# Patient Record
Sex: Male | Born: 2000 | Race: Black or African American | Hispanic: No | Marital: Single
Health system: Midwestern US, Community
[De-identification: ages and names within clinical notes are randomized; demographics above are authoritative.]

## PROBLEM LIST (undated history)

## (undated) DIAGNOSIS — G4733 Obstructive sleep apnea (adult) (pediatric): Secondary | ICD-10-CM

## (undated) DIAGNOSIS — Z9089 Acquired absence of other organs: Secondary | ICD-10-CM

## (undated) DIAGNOSIS — J351 Hypertrophy of tonsils: Secondary | ICD-10-CM

## (undated) DIAGNOSIS — G8918 Other acute postprocedural pain: Secondary | ICD-10-CM

---
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
## 2021-03-08 ENCOUNTER — Encounter: Payer: PRIVATE HEALTH INSURANCE | Attending: Internal Medicine | Primary: Internal Medicine

## 2021-03-08 NOTE — Assessment & Plan Note (Deleted)
Patient established in October 2022 with concerns that he would not be allowed back to school until he was caught up on vaccinations. Patient advised to get vaccinated at area health department. Patients weight at establishment 233.

## 2021-06-21 NOTE — Progress Notes (Signed)
Formatting of this note is different from the original.  Images from the original note were not included.  [facility]  [facility]  [address]  [place] [address]  Loc Appt: [phone]  Loc: [phone]     Name: Mike Marks  Age:  21 y.o.  Sex: male    CHIEF COMPLAINT:    Chief Complaint   Patient presents with    Nasal Congestion     Pt c/o congestion, asthma eczema, and cough for 3 days.      HPI:   The patient is a 21 year old obese male who presents today with multiple complaints.  Patient reports a history of allergic rhinitis, asthma, and eczema.      He reports a more than 1 month history of nasal congestion, rhinorrhea.  He has had some associated cough and postnasal drainage.  He is also had some fatigue.  He reports that the nasal congestion is interfering with his sleep.  He has been taking Advil DM for his symptoms, but this has not helped much.  This that he was on an antibiotic that she got from relative couple weeks ago and this seemed to help.    Patient also complaining of eczematous rash on the left AC fossa.  He has not done anything to treat this.  He does bathe with Dove soap.    Patient's mother is on the phone and reports that his symptoms have caused him to miss some work.  She is requesting documentation of his asthma, allergic rhinitis, and eczema.  I advised patient's mother that I could document allergic rhinitis and eczema but asthma would require pulmonary function tests which we would not perform here in the urgent care setting.  I advised her to follow-up with primary care if she needs more documentation than what I provided.    PAST MEDICAL HISTORY:  No past medical history on file.    PAST SURGICAL HISTORY:  No past surgical history on file.    MEDICATIONS:  Current Outpatient Medications   Medication Sig Dispense Refill    benzonatate (TESSALON PERLES) 100 mg capsule Take 1-2 capsules (100-200 mg) every 8 (eight) hours for 7 days 40  capsule 0    fexofenadine (Allergy Relief, fexofenadine,) 180 mg tablet Take 1 tablet (180 mg) by mouth daily 30 tablet 0    fluticasone propionate (FLONASE) 50 mcg/actuation nasal spray 1 spray into each nostril daily for 10 days 16 g 0    predniSONE 10 mg tablet Take 2 tablets (20 mg) by mouth daily for 5 days 10 tablet 0    triamcinolone (KENALOG) 0.1 % cream Apply topically 2 (two) times a day for 7 days 80 g 0     No current facility-administered medications for this visit.     ALLERGIES:  Patient has no known allergies.    SOCIAL HISTORY:      FAMILY HISTORY:  No family history on file.     REVIEW OF SYSTEMS:  Review of Systems   Constitutional:  Positive for activity change and fatigue. Negative for appetite change, chills and fever.   HENT:  Positive for congestion, postnasal drip and rhinorrhea. Negative for ear pain, sore throat and trouble swallowing.    Eyes:  Negative for pain, discharge, redness and itching.   Respiratory:  Positive for cough. Negative for chest tightness, shortness of breath and wheezing.    Cardiovascular:  Negative for chest pain and palpitations.   Gastrointestinal:  Negative for abdominal pain, blood in stool, constipation, diarrhea, nausea and vomiting.   Genitourinary:  Negative for decreased urine volume, dysuria, frequency, hematuria and urgency.   Musculoskeletal:  Negative for arthralgias, back pain, gait problem, joint swelling and myalgias.   Skin:  Positive for rash. Negative for color change and wound.   Neurological:  Negative for dizziness, seizures, speech difficulty, light-headedness, numbness and headaches.   Psychiatric/Behavioral:  Positive for sleep disturbance (Secondary to nasal congestion). Negative for agitation and confusion.      PHYSICAL EXAMINATION:  There were no vitals filed for this visit.  There is no height or weight on file to calculate BMI.  Vitals:    06/21/21 1133   BP: 124/80   Pulse: 57   Resp: 16   Temp: 97.6 F (36.4 C)   SpO2: 97%      Physical Exam  Vitals and nursing note reviewed.   Constitutional:       General: He is not in acute distress.     Appearance: He is well-developed. He is obese. He is not ill-appearing, toxic-appearing or diaphoretic.   HENT:      Head: Normocephalic and atraumatic.      Right Ear: Tympanic membrane, ear canal and external ear normal. There is no impacted cerumen.      Left Ear: Tympanic membrane, ear canal and external ear normal. There is no impacted cerumen.      Nose: Congestion and rhinorrhea present.      Mouth/Throat:      Mouth: Mucous membranes are moist.      Pharynx: Uvula midline. No oropharyngeal exudate or posterior oropharyngeal erythema.      Tonsils: No tonsillar abscesses.   Eyes:      General:         Right eye: No discharge.         Left eye: No discharge.      Extraocular Movements: Extraocular movements intact.      Conjunctiva/sclera: Conjunctivae normal.   Cardiovascular:      Rate and Rhythm: Normal rate and regular rhythm.      Heart sounds: No murmur heard.     No friction rub. No gallop.   Pulmonary:      Effort: Pulmonary effort is normal. No respiratory distress.      Breath sounds: Normal breath sounds. No stridor. No wheezing, rhonchi or rales.   Abdominal:      General: Bowel sounds are normal. There is no distension.      Palpations: Abdomen is soft.      Tenderness: There is no abdominal tenderness. There is no guarding or rebound.   Musculoskeletal:         General: No tenderness. Normal range of motion.      Cervical back: Normal range of motion and neck supple.   Lymphadenopathy:      Cervical: No cervical adenopathy.   Skin:     General: Skin is warm and dry.      Findings: Rash present. No erythema.          Comments: Eczematous rash on the left AC fossa   Neurological:      General: No focal deficit present.      Mental Status: He is alert and oriented to person, place, and time. Mental status is at baseline.      Motor: No abnormal muscle tone.      Coordination:  Coordination normal.   Psychiatric:  Mood and Affect: Mood normal.         Behavior: Behavior normal.         Thought Content: Thought content normal.         Judgment: Judgment normal.     RESULTS:    ASSESSMENT(S):  1. Allergic rhinitis, unspecified seasonality, unspecified trigger    2. Flexural eczema    3. Acute cough      TREATMENT:    Orders:      Orders Placed This Encounter   Procedures    SARS Antigen FIA - POC     Order Specific Question:   Release to patient     Answer:   Immediate     Discharge Prescriptions:    Discharge Medication List          Accurate as of Jun 21, 2021 12:00 PM. If you have any questions, ask your nurse or doctor.             New Medications      benzonatate 100 mg capsule  Sig: Take 1-2 capsules (100-200 mg) every 8 (eight) hours for 7 days  Qty: 40 capsule  Refills: 0  Commonly known as: TESSALON PERLES    fexofenadine 180 mg tablet  Sig: Take 1 tablet (180 mg) by mouth daily  Qty: 30 tablet  Refills: 0  Commonly known as: Allergy Relief (fexofenadine)    fluticasone propionate 50 mcg/actuation nasal spray  Sig: 1 spray into each nostril daily for 10 days  Qty: 16 g  Refills: 0  Commonly known as: FLONASE    predniSONE 10 mg tablet  Sig: Take 2 tablets (20 mg) by mouth daily for 5 days  Qty: 10 tablet  Refills: 0    triamcinolone 0.1 % cream  Sig: Apply topically 2 (two) times a day for 7 days  Qty: 80 g  Refills: 0  Commonly known as: KENALOG          PROCEDURES:  Procedures    MDM    PLAN/PATIENT INSTRUCTIONS:  The rapid COVID test was negative.    Your symptoms are significant for undertreated allergic rhinitis.      I have prescribed a short course of oral steroid, prednisone, to be taken once daily in the morning starting this morning.  This should bridge to while you are starting your allergy medications.  I have prescribed Allegra to be taken once daily at bedtime starting tonight.  I have also prescribed Flonase to be used once or twice daily.  I have prescribed  Tessalon Perles for cough.    On exam, I do not appreciate any wheezing.    The rash on your left arm is significant for eczema.  I have prescribed a topical steroid, triamcinolone, to be used once or twice daily for the next 7 to 10 days.    I encourage you to follow-up with your primary care provider as scheduled on 06/30/2021.  You can discuss further management of your chronic atopy.  You may need to be on other medications such as Singulair or have a rescue inhaler.    Flo Shanks, MD      Electronically signed by Theola Sequin, MD at 06/21/2021 12:01 PM EDT

## 2021-11-21 ENCOUNTER — Encounter: Payer: PRIVATE HEALTH INSURANCE | Attending: Internal Medicine | Primary: Internal Medicine

## 2021-12-14 ENCOUNTER — Telehealth

## 2021-12-14 NOTE — Telephone Encounter (Signed)
Referral placed.

## 2021-12-14 NOTE — Telephone Encounter (Signed)
Patient would like a referral sent to ENT for a eval for a Tonsillectomy

## 2021-12-14 NOTE — Telephone Encounter (Signed)
-----   Message from [facility] sent at 12/14/2021  2:54 PM EDT -----  Subject: Referral Request    Reason for referral request? Patient needs a referral to get his Tonsils   taken out.   Provider patient wants to be referred to(if known):     Provider Phone Number(if known):    Additional Information for Provider?   ---------------------------------------------------------------------------  --------------  [place]    [phone]; OK to leave message on voicemail  ---------------------------------------------------------------------------  --------------

## 2021-12-20 ENCOUNTER — Encounter

## 2021-12-20 ENCOUNTER — Ambulatory Visit
Admit: 2021-12-20 | Discharge: 2021-12-20 | Payer: PRIVATE HEALTH INSURANCE | Attending: Otolaryngology | Primary: Internal Medicine

## 2021-12-20 DIAGNOSIS — G4733 Obstructive sleep apnea (adult) (pediatric): Secondary | ICD-10-CM

## 2021-12-20 NOTE — Progress Notes (Signed)
[facility] Office Note    Patient: Mike Marks  MRN: [id number]  DOB: Mar 20, 2000  Gender:  male  Vital Signs: BP 115/70 (Site: Left Upper Arm, Position: Sitting)   Pulse 67   Ht 1.702 m (5\' 7" )   Wt 104.1 kg (229 lb 9.6 oz)   SpO2 97%   BMI 35.96 kg/m   Date: 12/20/2021    CC:   Chief Complaint   Patient presents with    New Patient     Patient presents today for hypertrophy of tonsils       HPI:  Mike Marks is a 21 y.o. male here for evaluation of tonsil hypertrophy.  Notes from referring provider reviewed.  He endorses recurrent tonsillitis.  He endorses approximately 4 episodes a year for over 3 years in a row.  He works for [employer] and and has had to miss work frequently.  He snores.  He denies tonsil stones.    Past Medical History, Past Surgical History, Family history, Social History, and Medications were all reviewed with the patient today and updated as necessary.     No Known Allergies  Patient Active Problem List   Diagnosis    Establishing care with new doctor, encounter for    Obesity due to excess calories without serious comorbidity    Gastroesophageal reflux disease     No current outpatient medications on file.     No current facility-administered medications for this visit.     Past Medical History:   Diagnosis Date    Rash      Social History     Tobacco Use    Smoking status: Never    Smokeless tobacco: Never   Substance Use Topics    Alcohol use: Never     History reviewed. No pertinent surgical history.  Family History   Problem Relation Age of Onset    Hypertension Mother     Asthma Father     Cancer Maternal Aunt     Asthma Paternal Aunt     Cancer Maternal Grandmother         ROS:    Review of Systems   Constitutional:  Negative for activity change and appetite change.   HENT:  Positive for sneezing and trouble swallowing. Negative for congestion, ear discharge, ear pain and sore throat.    Eyes:  Negative for redness and itching.   Respiratory:  Negative for shortness of breath.     Cardiovascular:  Negative for chest pain.   Gastrointestinal:  Negative for vomiting.   Endocrine: Negative for cold intolerance and heat intolerance.   Allergic/Immunologic: Negative for environmental allergies.   Neurological:  Negative for light-headedness.   Psychiatric/Behavioral:  Negative for sleep disturbance.           PHYSICAL EXAM:    BP 115/70 (Site: Left Upper Arm, Position: Sitting)   Pulse 67   Ht 1.702 m (5\' 7" )   Wt 104.1 kg (229 lb 9.6 oz)   SpO2 97%   BMI 35.96 kg/m     General: NAD, well-appearing  Neuro: No gross neuro deficits. CN's II-XII intact. No facial weakness.  Eyes: EOMI. Pupils reactive. No periorbital edema/ecchymosis.   Skin: No facial erythema, rashes or concerning lesions.  Nose: No external deviations or saddling. Intranasally, septum is midline without perforations, nasal mucosa appears healthy with no erythema, mucopurulence, or polyps.  Mouth: Moist mucus membranes, normal tongue/palate mobility, no concerning mucosal lesions.   Oropharynx: clear with no erythema/exudate, 3+ tonsillar  hypertrophy.  Ears: Normal appearing auricles, no hematomas. EACs clear with no cerumen impaction, healthy canal skin, TM's intact with no perforations or retraction pockets. No middle ear effusions.  Neck: Soft, supple, no palpable lateral neck masses. No parotid or submandibular masses. No thyromegaly or palpable thyroid nodules. No surgical scars.  Lymphatics: Palpable 2 x 1 cm right level 2 lymph node  Resp/Lungs: No audible stridor or wheezing, CTAB  Heart: RRR  Extremities: No clubbing or cyanosis.      ASSESSMENT and PLAN  I recommend proceeding w/ a tonsillectomy and adenoidectomy. I dicussed all the risks of surgery including bleeding w/ need to return to OR urgently, dehydration, tongue numbness, and damage to teeth/lips/gums and they would like to proceed.      ICD-10-CM    1. OSA (obstructive sleep apnea)  G47.33       2. Tonsillar hypertrophy  J35.1       3. Recurrent  tonsillitis  J03.91       4. Cervical lymphadenopathy  R59.0               Katrina Stack, MD  12/20/2021  Electronically signed    Note dictated using voice recognition software.  Please excuse any typos.

## 2022-01-10 ENCOUNTER — Encounter

## 2022-01-24 ENCOUNTER — Inpatient Hospital Stay: Admit: 2022-01-24 | Payer: PRIVATE HEALTH INSURANCE | Primary: Internal Medicine

## 2022-01-24 ENCOUNTER — Encounter

## 2022-01-24 DIAGNOSIS — J3501 Chronic tonsillitis: Secondary | ICD-10-CM

## 2022-01-24 MED ORDER — PREDNISOLONE 15 MG/5ML PO SOLN
15 MG/5ML | ORAL | 0 refills | Status: AC
Start: 2022-01-24 — End: ?

## 2022-01-24 MED ORDER — OXYCODONE-ACETAMINOPHEN 5-325 MG PO TABS
5-325 MG | ORAL_TABLET | Freq: Four times a day (QID) | ORAL | 0 refills | Status: AC | PRN
Start: 2022-01-24 — End: 2022-01-31

## 2022-01-24 MED ORDER — ONDANSETRON 4 MG PO TBDP
4 MG | ORAL_TABLET | Freq: Three times a day (TID) | ORAL | 0 refills | Status: AC | PRN
Start: 2022-01-24 — End: ?

## 2022-01-25 ENCOUNTER — Encounter: Payer: PRIVATE HEALTH INSURANCE | Attending: Otolaryngology | Primary: Internal Medicine

## 2022-02-07 ENCOUNTER — Inpatient Hospital Stay: Payer: PRIVATE HEALTH INSURANCE

## 2022-02-09 ENCOUNTER — Ambulatory Visit
Admit: 2022-02-09 | Discharge: 2022-02-09 | Payer: PRIVATE HEALTH INSURANCE | Attending: Otolaryngology | Primary: Internal Medicine

## 2022-02-09 DIAGNOSIS — Z9089 Acquired absence of other organs: Secondary | ICD-10-CM

## 2022-02-09 NOTE — Progress Notes (Signed)
[facility] Office Note    Patient: Mike Marks  MRN: [id number]  DOB: August 24, 2000  Gender:  male  Vital Signs: There were no vitals taken for this visit.  Date: 02/09/2022    CC:   Chief Complaint   Patient presents with    Post-Op Check     Patient presents today for a post op check after a T&A. He reports no issues at this time but has had improvement in sleep       HPI:  Mike Marks is a 21 y.o. male status post adenotonsillectomy on 01-24-2022.  He is recovering appropriately.    Past Medical History, Past Surgical History, Family history, Social History, and Medications were all reviewed with the patient today and updated as necessary.     No Known Allergies  Patient Active Problem List   Diagnosis    Establishing care with new doctor, encounter for    Obesity due to excess calories without serious comorbidity    Gastroesophageal reflux disease    OSA (obstructive sleep apnea)    Tonsil and adenoid disease, chronic    Tonsillar hypertrophy    Recurrent tonsillitis    Hypertrophy of tonsils     Current Outpatient Medications   Medication Sig    prednisoLONE 15 MG/5ML solution Take 15 mL once daily by mouth for 3 days and then take 10 mL by mouth once daily for 2 days and then take 1 mouth once daily for 2 days.  Take medicine in the morning.    ondansetron (ZOFRAN-ODT) 4 MG disintegrating tablet Take 1 tablet by mouth 3 times daily as needed for Nausea or Vomiting     No current facility-administered medications for this visit.     Past Medical History:   Diagnosis Date    Rash      Social History     Tobacco Use    Smoking status: Never    Smokeless tobacco: Never   Substance Use Topics    Alcohol use: Never     History reviewed. No pertinent surgical history.  Family History   Problem Relation Age of Onset    Hypertension Mother     Asthma Father     Cancer Maternal Aunt     Asthma Paternal Aunt     Cancer Maternal Grandmother         ROS:    Review of Systems   Constitutional:  Negative for activity change  and appetite change.   HENT:  Negative for congestion, ear discharge and ear pain.    Eyes:  Negative for redness and itching.   Respiratory:  Negative for shortness of breath.    Cardiovascular:  Negative for chest pain.   Gastrointestinal:  Negative for vomiting.   Endocrine: Negative for cold intolerance and heat intolerance.   Allergic/Immunologic: Negative for environmental allergies.   Neurological:  Negative for light-headedness.   Psychiatric/Behavioral:  Negative for sleep disturbance.           PHYSICAL EXAM:    There were no vitals taken for this visit.    General: NAD, well-appearing  Neuro: No gross neuro deficits. CN's II-XII intact. No facial weakness.  Eyes: EOMI. Pupils reactive. No periorbital edema/ecchymosis.   Skin: No facial erythema, rashes or concerning lesions.  Nose: No external deviations or saddling. Intranasally, septum is midline without perforations, nasal mucosa appears healthy with no erythema, mucopurulence, or polyps.  Mouth: Moist mucus membranes, normal tongue/palate mobility, no concerning mucosal lesions.   Oropharynx:  clear with no erythema/exudate, tonsil fossa granulating appropriately  Ears: Normal appearing auricles, no hematomas. EACs clear with no cerumen impaction, healthy canal skin, TM's intact with no perforations or retraction pockets. No middle ear effusions.  Neck: Soft, supple, no palpable lateral neck masses. No parotid or submandibular masses. No thyromegaly or palpable thyroid nodules. No surgical scars.  Lymphatics: No palpable cervical LAD.  Resp/Lungs: No audible stridor or wheezing, CTAB  Heart: RRR  Extremities: No clubbing or cyanosis.      ASSESSMENT and PLAN  Recovering appropriately.  Can follow back up as needed.    ICD-10-CM    1. S/P tonsillectomy  Z90.89               Katrina Stack, MD  02/09/2022  Electronically signed    Note dictated using voice recognition software.  Please excuse any typos.
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
